# Patient Record
Sex: Male | Born: 1975 | Race: White | Hispanic: No | Marital: Married | Smoking: Never smoker
Health system: Southern US, Community
[De-identification: ages and names within clinical notes are randomized; demographics above are authoritative.]

## PROBLEM LIST (undated history)

## (undated) DIAGNOSIS — M109 Gout, unspecified: Secondary | ICD-10-CM

---

## 2009-02-13 ENCOUNTER — Ambulatory Visit: Payer: Self-pay | Admitting: General Practice

## 2009-11-13 ENCOUNTER — Ambulatory Visit: Payer: Self-pay | Admitting: Unknown Physician Specialty

## 2009-11-14 LAB — PATHOLOGY REPORT

## 2011-09-02 ENCOUNTER — Ambulatory Visit: Payer: Self-pay | Admitting: General Practice

## 2011-09-09 ENCOUNTER — Other Ambulatory Visit: Payer: Self-pay | Admitting: Podiatry

## 2011-09-09 LAB — SYNOVIAL FLUID, CRYSTAL: Crystals, Joint Fluid: NONE SEEN

## 2011-10-03 ENCOUNTER — Emergency Department: Payer: Self-pay | Admitting: Emergency Medicine

## 2011-10-03 LAB — CBC WITH DIFFERENTIAL/PLATELET
Basophil #: 0.1 10*3/uL (ref 0.0–0.1)
Lymphocyte %: 22.7 %
MCH: 31.4 pg (ref 26.0–34.0)
MCHC: 35.4 g/dL (ref 32.0–36.0)
Monocyte %: 8.6 %
Neutrophil #: 6.9 10*3/uL — ABNORMAL HIGH (ref 1.4–6.5)
Neutrophil %: 65.4 %
Platelet: 231 10*3/uL (ref 150–440)
RBC: 4.92 10*6/uL (ref 4.40–5.90)
RDW: 13.3 % (ref 11.5–14.5)

## 2011-10-03 LAB — URIC ACID: Uric Acid: 7.8 mg/dL — ABNORMAL HIGH (ref 3.5–7.2)

## 2013-01-07 IMAGING — CR RIGHT GREAT TOE
1 series · 3 of 3 positions shown · non-contrast
Comparison: none

REASON FOR EXAM: redness swelling pain
COMMENTS:

PROCEDURE:     KDR - KDXR TOE GREAT(1ST DIGIT)RT FOOT  - September 02, 2011  [DATE]
RESULT:     Comparison: None.

[Series 1: ap · 0.17mm/px · 3 of 3 slices shown]
[im 1/3]
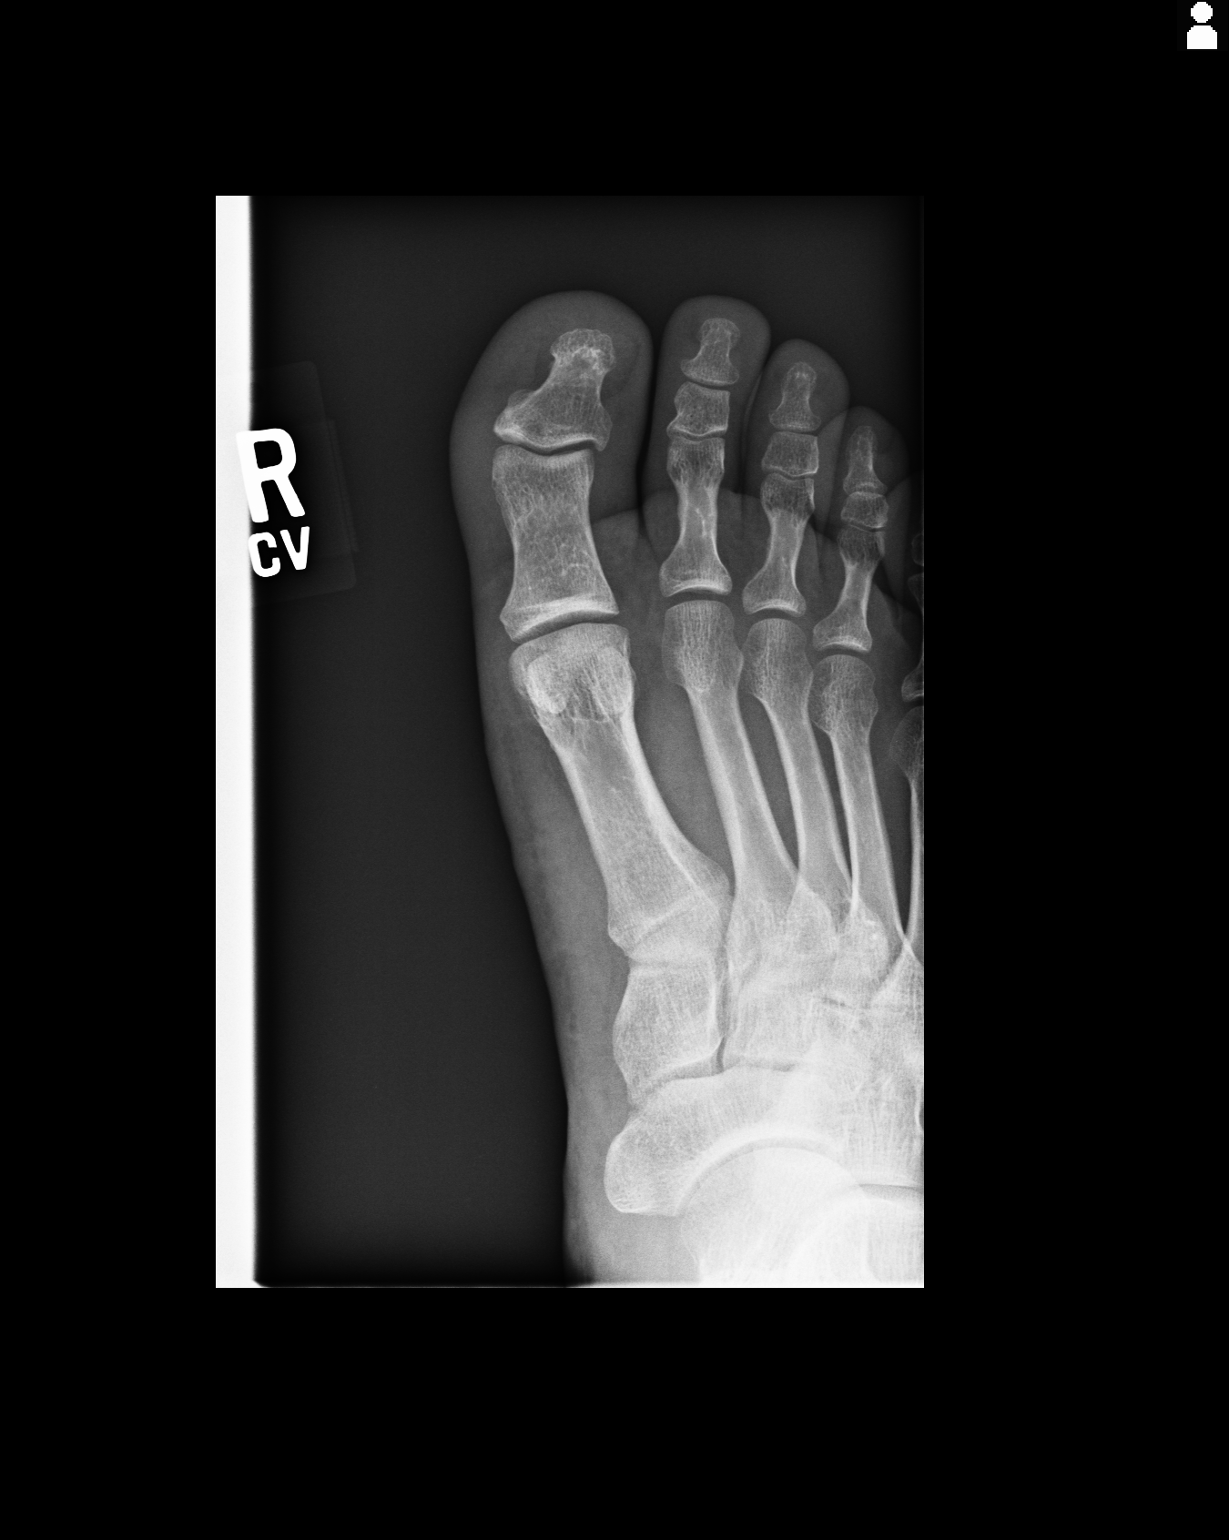
[im 2/3]
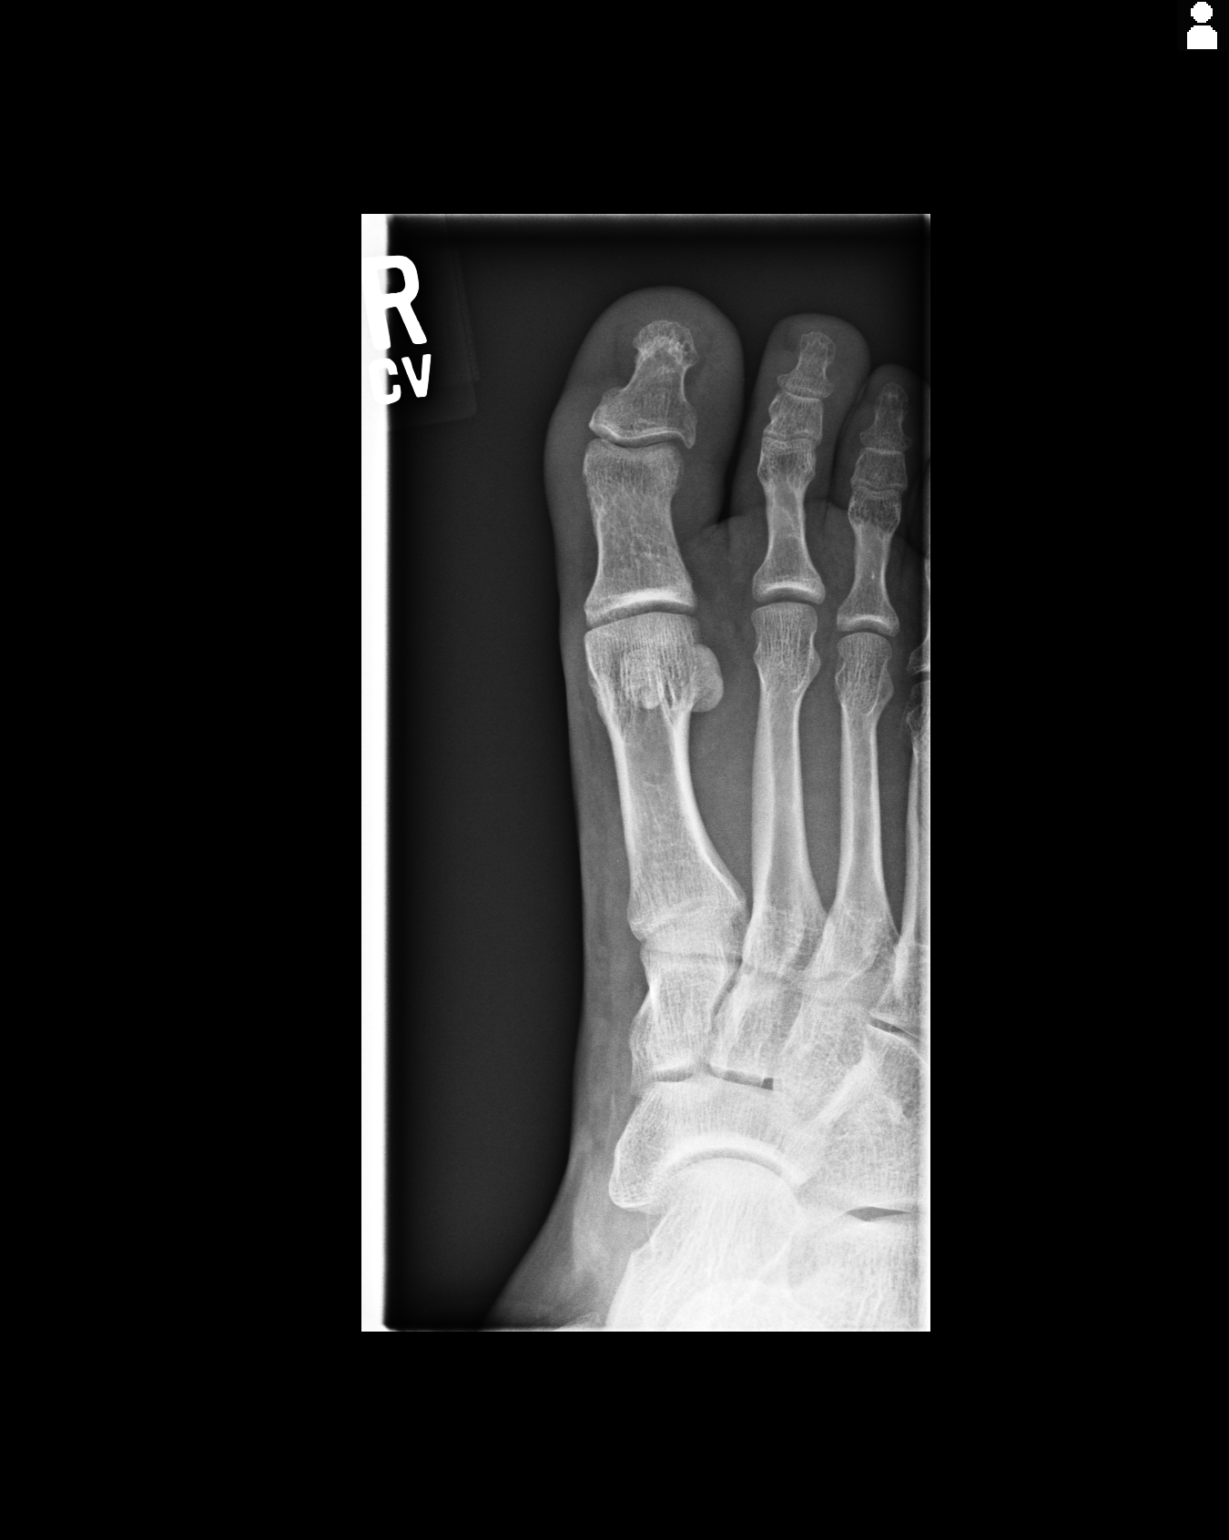
[im 3/3]
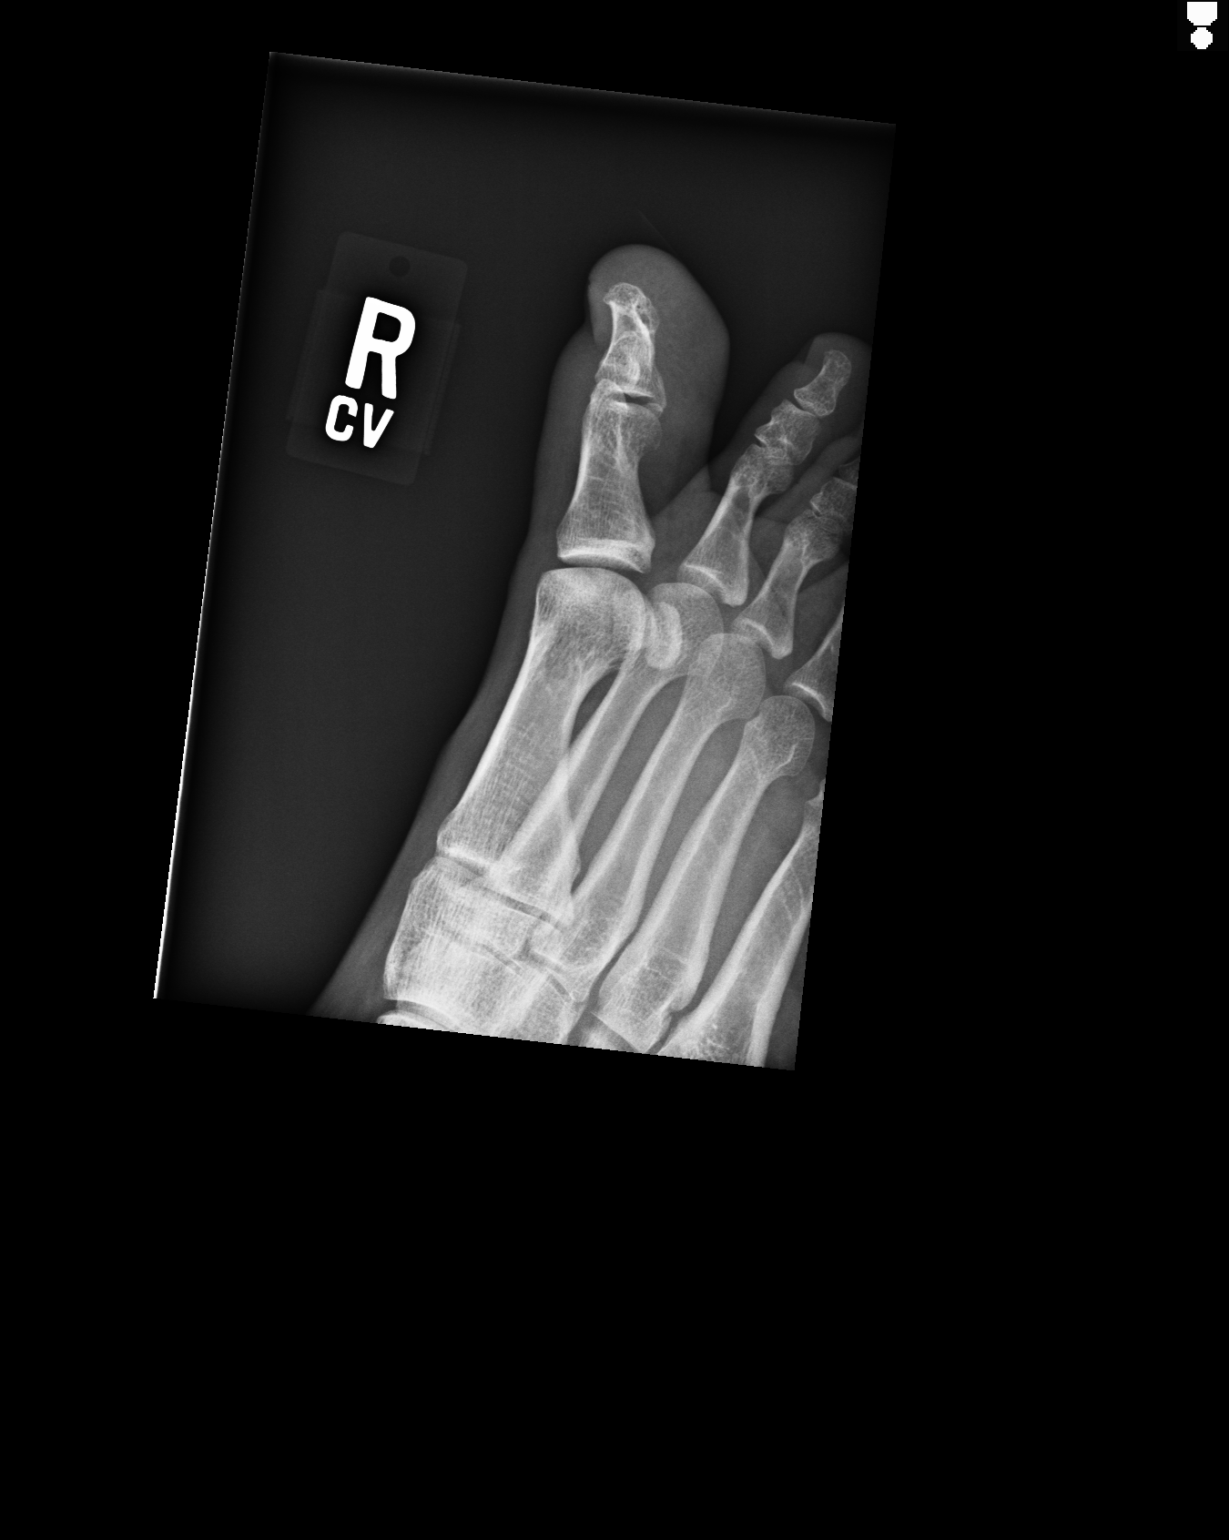

[3 of 3 positions shown; findings below may reference images not displayed]

FINDINGS: No acute fracture. No erosive changes. Joint spaces are maintained.
IMPRESSION: No acute findings.

[REDACTED]

## 2016-02-08 ENCOUNTER — Ambulatory Visit: Payer: Self-pay | Admitting: Physician Assistant

## 2016-02-11 ENCOUNTER — Encounter: Payer: Self-pay | Admitting: Physician Assistant

## 2016-02-11 ENCOUNTER — Ambulatory Visit: Payer: Self-pay | Admitting: Physician Assistant

## 2016-02-11 VITALS — BP 120/80 | HR 85 | Temp 98.4°F | Ht 67.0 in | Wt 228.0 lb

## 2016-02-11 DIAGNOSIS — Z0000 Encounter for general adult medical examination without abnormal findings: Secondary | ICD-10-CM

## 2016-02-11 MED ORDER — FLUTICASONE PROPIONATE 50 MCG/ACT NA SUSP: 2.0000 | Freq: Every day | NASAL | 12 refills | Status: DC

## 2016-02-11 NOTE — Progress Notes (Signed)
S: here for physical and wellness encounter for biometric forms, ros neg, med: flonase, nkda, nonsmoker, married, no children, fam hx + colon ca, had a colonoscopy screening nov 2017 which was normal  O: vitals wnl, nad, ENT wnl, neck supple no lymph, lungs c t a, cv rrr, abd soft nontender bs normal all 4 quads, prostate exam deferred by pt, ms wnl, n/v intact  A: well adult  P: flonase refill, labs drawn today

## 2016-02-12 LAB — CMP12+LP+TP+TSH+6AC+PSA+CBC…
ALT: 35 IU/L (ref 0–44)
AST: 26 IU/L (ref 0–40)
Albumin/Globulin Ratio: 1.8 (ref 1.2–2.2)
Albumin: 4.7 g/dL (ref 3.5–5.5)
Alkaline Phosphatase: 53 IU/L (ref 39–117)
BASOS: 1 %
BILIRUBIN TOTAL: 0.8 mg/dL (ref 0.0–1.2)
BUN / CREAT RATIO: 8 — AB (ref 9–20)
BUN: 9 mg/dL (ref 6–24)
Basophils Absolute: 0.1 10*3/uL (ref 0.0–0.2)
CALCIUM: 9.4 mg/dL (ref 8.7–10.2)
CHLORIDE: 100 mmol/L (ref 96–106)
CREATININE: 1.11 mg/dL (ref 0.76–1.27)
Chol/HDL Ratio: 5.4 ratio units — ABNORMAL HIGH (ref 0.0–5.0)
Cholesterol, Total: 190 mg/dL (ref 100–199)
EOS (ABSOLUTE): 0.1 10*3/uL (ref 0.0–0.4)
EOS: 1 %
Estimated CHD Risk: 1.1 times avg. — ABNORMAL HIGH (ref 0.0–1.0)
Free Thyroxine Index: 1.5 (ref 1.2–4.9)
GFR, EST AFRICAN AMERICAN: 94 mL/min/{1.73_m2} (ref >59)
GFR, EST NON AFRICAN AMERICAN: 81 mL/min/{1.73_m2} (ref >59)
GGT: 62 IU/L (ref 0–65)
GLUCOSE: 100 mg/dL — AB (ref 65–99)
Globulin, Total: 2.6 g/dL (ref 1.5–4.5)
HDL: 35 mg/dL — ABNORMAL LOW (ref >39)
Hematocrit: 43.3 % (ref 37.5–51.0)
Hemoglobin: 15.3 g/dL (ref 13.0–17.7)
Immature Grans (Abs): 0 10*3/uL (ref 0.0–0.1)
Immature Granulocytes: 0 %
Iron: 111 ug/dL (ref 38–169)
LDH: 185 IU/L (ref 121–224)
LDL Calculated: 95 mg/dL (ref 0–99)
Lymphocytes Absolute: 2.3 10*3/uL (ref 0.7–3.1)
Lymphs: 28 %
MCH: 30.9 pg (ref 26.6–33.0)
MCHC: 35.3 g/dL (ref 31.5–35.7)
MCV: 88 fL (ref 79–97)
MONOCYTES: 8 %
Monocytes Absolute: 0.7 10*3/uL (ref 0.1–0.9)
Neutrophils Absolute: 5.2 10*3/uL (ref 1.4–7.0)
Neutrophils: 62 %
PHOSPHORUS: 2.9 mg/dL (ref 2.5–4.5)
POTASSIUM: 4.4 mmol/L (ref 3.5–5.2)
Platelets: 245 10*3/uL (ref 150–379)
Prostate Specific Ag, Serum: 0.7 ng/mL (ref 0.0–4.0)
RBC: 4.95 x10E6/uL (ref 4.14–5.80)
RDW: 13.6 % (ref 12.3–15.4)
Sodium: 141 mmol/L (ref 134–144)
T3 Uptake Ratio: 28 % (ref 24–39)
T4, Total: 5.4 ug/dL (ref 4.5–12.0)
TSH: 0.996 u[IU]/mL (ref 0.450–4.500)
Total Protein: 7.3 g/dL (ref 6.0–8.5)
Triglycerides: 300 mg/dL — ABNORMAL HIGH (ref 0–149)
URIC ACID: 10.1 mg/dL — AB (ref 3.7–8.6)
VLDL CHOLESTEROL CAL: 60 mg/dL — AB (ref 5–40)
WBC: 8.4 10*3/uL (ref 3.4–10.8)

## 2016-02-12 LAB — VITAMIN D 25 HYDROXY (VIT D DEFICIENCY, FRACTURES): Vit D, 25-Hydroxy: 18.5 ng/mL — ABNORMAL LOW (ref 30.0–100.0)

## 2016-02-19 ENCOUNTER — Ambulatory Visit: Payer: Self-pay | Admitting: Physician Assistant

## 2016-02-19 VITALS — BP 139/79 | HR 104 | Temp 98.8°F

## 2016-02-19 DIAGNOSIS — E559 Vitamin D deficiency, unspecified: Secondary | ICD-10-CM

## 2016-02-19 DIAGNOSIS — J069 Acute upper respiratory infection, unspecified: Secondary | ICD-10-CM

## 2016-02-19 LAB — POCT INFLUENZA A/B
Influenza A, POC: NEGATIVE
Influenza B, POC: NEGATIVE

## 2016-02-19 MED ORDER — VITAMIN D (ERGOCALCIFEROL) 1.25 MG (50000 UNIT) PO CAPS: 50000.0000 [IU] | ORAL_CAPSULE | ORAL | Status: DC

## 2016-02-19 MED ORDER — VITAMIN D (ERGOCALCIFEROL) 1.25 MG (50000 UNIT) PO CAPS: 50000.0000 [IU] | ORAL_CAPSULE | ORAL | 8 refills | Status: DC

## 2016-02-19 NOTE — Progress Notes (Signed)
S: here with flu-like sx that started.  Has been taking OTC medication with some relief of sx except for coughing at night.  No fever known.  No N,V, D.    O: TMs dull bilat, nose clr, throat minimal injection, neck supple without aden.  Lung without wheeze but very course cough.  Heart RRR  A: Viral illness Also Hypovitaminosis D  P: Robitussin AC every 4 hours prn cough and Vitamin D 50,000 mg once a week and plan to recheck this summer.

## 2016-11-02 ENCOUNTER — Other Ambulatory Visit: Payer: Self-pay | Admitting: Emergency Medicine

## 2016-11-03 NOTE — Telephone Encounter (Signed)
Med refill for vit d approved

## 2017-04-16 ENCOUNTER — Ambulatory Visit: Payer: Self-pay | Admitting: Family Medicine

## 2017-04-16 ENCOUNTER — Encounter: Payer: Self-pay | Admitting: Family Medicine

## 2017-04-16 VITALS — BP 120/82 | HR 82 | Resp 16 | Ht 67.0 in | Wt 230.0 lb

## 2017-04-16 DIAGNOSIS — Z0000 Encounter for general adult medical examination without abnormal findings: Secondary | ICD-10-CM

## 2017-04-16 NOTE — Progress Notes (Signed)
Subjective: Annual biometrics screening  Patient presents for his annual biometric screening. Patient denies any medical problems except for a history of colon polyps.  Patient's father had colon cancer and he began early screening years ago.  Patient's last colonoscopy was clear.  Patient is not currently see a primary care provider.  Patient completed biometric screening lab results at the [facility] department.  His fasting blood sugar was 116, total cholesterol 252, and HDL 34. Patient works for the [employer] in the [employer]. Patient denies any other issues or concerns.   Review of Systems Constitutional: Unremarkable.  HEENT: Denies dizziness, issues with hearing, vision problems.  Gastrointestinal: Denies issues with bowel or bladder.  Respiratory: Unremarkable.   Cardiovascular: Unremarkable.  Musculoskeletal: No significant arthralgias, myalgias, joint swelling, joint stiffness, back pain, neck pain. ROS otherwise negative.   Objective  Physical Exam General: Awake, alert and oriented. No acute distress. Well developed, hydrated and nourished. Appears stated age.  HEENT: Supple neck without adenopathy. Sclera is non-icteric. The ear canal is clear without discharge. The tympanic membrane is normal in appearance with normal landmarks and cone of light. Nasal mucosa is pink and moist. Oral mucosa is pink and moist. The pharynx is normal in appearance without tonsillar swelling or exudates.  Skin: Skin in warm, dry and intact without rashes or lesions. Appropriate color for ethnicity. Cardiac: Heart rate and rhythm are normal. No murmurs, gallops, or rubs are auscultated.  Respiratory: The chest wall is symmetric and without deformity. No signs of respiratory distress. Lung sounds are clear in all lobes bilaterally without rales, ronchi, or wheezes.  Abdominal: Abdomen is soft, symmetric, and non-tender without distention. No masses, hepatomegaly, or splenomegaly are  noted.  Spine: Neck and back are without deformity. Curvature of the cervical, thoracic, and lumbar spine are within normal limits. Posture is upright, gait is smooth, steady, and within normal limits. No tenderness noted on palpation of the spinous processes. Spinous processes are midline. No discomfort is noted with flexion, extension, and side-to-side rotation of the cervical/thoracic/lumbar spine, full range of motion is noted. Grip strength is normal bilaterally.  Extremities: Full range of motion is noted to all major joints. Steady gait noted.  Neurological: The patient is awake, alert and oriented to person, place, and time with normal speech.  Memory is normal and thought processes intact. No gait abnormalities are appreciated.  Psychiatric: Appropriate mood and affect.   Assessment Annual biometrics screen  Plan  Labs pending. Encouraged routine visits with primary care provider.  Provided patient with resources to establish care with a new primary care provider.  Educated patient regarding his abnormal fasting glucose and lipid panel and the implications of these abnormal values.  Advised patient to discuss this with his primary care provider at his first visit within the next month.

## 2018-03-05 ENCOUNTER — Other Ambulatory Visit: Payer: Self-pay

## 2018-03-05 ENCOUNTER — Ambulatory Visit: Payer: Self-pay | Admitting: Emergency Medicine

## 2018-03-05 VITALS — BP 118/86 | HR 86 | Temp 98.2°F | Resp 12

## 2018-03-05 DIAGNOSIS — M25561 Pain in right knee: Secondary | ICD-10-CM

## 2018-03-05 MED ORDER — INDOMETHACIN 50 MG PO CAPS: 50.0000 mg | ORAL_CAPSULE | Freq: Three times a day (TID) | ORAL | 0 refills | Status: AC

## 2018-03-05 NOTE — Progress Notes (Signed)
Subjective: Patient was doing some weightlifting on Monday.  He did not remember a specific injury.  On Wednesday he started having pain and discomfort in his right knee.  He has had significant swelling in the knee which also feels slightly warm.  He has had no fever or chills.  His knee feels stiff due to the fluid on his knee.  The knee feels warm but there is no redness.  He does not feel ill.  Patient took naproxen yesterday.  His main concern is inability to bend his knee.  Of note in 2013 he had aspiration of a joint.  He does have a history of gout in his great toes.  Previous uric acid was 6.7 this was in 2013 Review of systems: He is a drinker primarily on weekends. Objective: Left knee examination is normal. Right knee there is a significant joint effusion.  There is a mild increase in warmth but no redness.  Patient is able to flex and extend without discomfort.  There is no medial or lateral instability.  Anterior drawer sign is negative.  There is limited full extension due to fluid on the joint. Assessment: Patient presents with right knee effusion with previous history of gout.  He does have a family history of gout and does consume alcohol. Plan: Indomethacin 50 mg every 8. Follow-up with PCP Check uric acid and basic metabolic panel. Instruction sheet related to gout given to patient.

## 2018-03-05 NOTE — Patient Instructions (Signed)
Take your indomethacin as instructed. Please restrict activity for the next few days. You can try an ice pack to your knee for pain relief. Please establish yourself with a primary care physician. We will call you with lab results.     Gout  Gout is painful swelling of your joints. Gout is a type of arthritis. It is caused by having too much uric acid in your body. Uric acid is a chemical that is made when your body breaks down substances called purines. If your body has too much uric acid, sharp crystals can form and build up in your joints. This causes pain and swelling. Gout attacks can happen quickly and be very painful (acute gout). Over time, the attacks can affect more joints and happen more often (chronic gout). What are the causes?  Too much uric acid in your blood. This can happen because: ? Your kidneys do not remove enough uric acid from your blood. ? Your body makes too much uric acid. ? You eat too many foods that are high in purines. These foods include organ meats, some seafood, and beer.  Trauma or stress. What increases the risk?  Having a family history of gout.  Being male and middle-aged.  Being male and having gone through menopause.  Being very overweight (obese).  Drinking alcohol, especially beer.  Not having enough water in the body (being dehydrated).  Losing weight too quickly.  Having an organ transplant.  Having lead poisoning.  Taking certain medicines.  Having kidney disease.  Having a skin condition called psoriasis. What are the signs or symptoms? An attack of acute gout usually happens in just one joint. The most common place is the big toe. Attacks often start at night. Other joints that may be affected include joints of the feet, ankle, knee, fingers, wrist, or elbow. Symptoms of an attack may include:  Very bad pain.  Warmth.  Swelling.  Stiffness.  Shiny, red, or purple skin.  Tenderness. The affected joint may be  very painful to touch.  Chills and fever. Chronic gout may cause symptoms more often. More joints may be involved. You may also have white or yellow lumps (tophi) on your hands or feet or in other areas near your joints. How is this treated?  Treatment for this condition has two phases: treating an acute attack and preventing future attacks.  Acute gout treatment may include: ? NSAIDs. ? Steroids. These are taken by mouth or injected into a joint. ? Colchicine. This medicine relieves pain and swelling. It can be given by mouth or through an IV tube.  Preventive treatment may include: ? Taking small doses of NSAIDs or colchicine daily. ? Using a medicine that reduces uric acid levels in your blood. ? Making changes to your diet. You may need to see a food expert (dietitian) about what to eat and drink to prevent gout. Follow these instructions at home: During a gout attack   If told, put ice on the painful area: ? Put ice in a plastic bag. ? Place a towel between your skin and the bag. ? Leave the ice on for 20 minutes, 2-3 times a day.  Raise (elevate) the painful joint above the level of your heart as often as you can.  Rest the joint as much as possible. If the joint is in your leg, you may be given crutches.  Follow instructions from your doctor about what you cannot eat or drink. Avoiding future gout attacks  Eat a  low-purine diet. Avoid foods and drinks such as: ? Liver. ? Kidney. ? Anchovies. ? Asparagus. ? Herring. ? Mushrooms. ? Mussels. ? Beer.  Stay at a healthy weight. If you want to lose weight, talk with your doctor. Do not lose weight too fast.  Start or continue an exercise plan as told by your doctor. Eating and drinking  Drink enough fluids to keep your pee (urine) pale yellow.  If you drink alcohol: ? Limit how much you use to:  0-1 drink a day for women.  0-2 drinks a day for men. ? Be aware of how much alcohol is in your drink. In the [place]  one drink equals one 12 oz bottle of beer (355 mL), one 5 oz glass of wine (148 mL), or one 1 oz glass of hard liquor (44 mL). General instructions  Take over-the-counter and prescription medicines only as told by your doctor.  Do not drive or use heavy machinery while taking prescription pain medicine.  Return to your normal activities as told by your doctor. Ask your doctor what activities are safe for you.  Keep all follow-up visits as told by your doctor. This is important. Contact a doctor if:  You have another gout attack.  You still have symptoms of a gout attack after 10 days of treatment.  You have problems (side effects) because of your medicines.  You have chills or a fever.  You have burning pain when you pee (urinate).  You have pain in your lower back or belly. Get help right away if:  You have very bad pain.  Your pain cannot be controlled.  You cannot pee. Summary  Gout is painful swelling of the joints.  The most common site of pain is the big toe, but it can affect other joints.  Medicines and avoiding some foods can help to prevent and treat gout attacks. This information is not intended to replace advice given to you by your health care provider. Make sure you discuss any questions you have with your health care provider. Document Released: 10/23/2007 Document Revised: 08/05/2017 Document Reviewed: 08/05/2017 Elsevier Interactive Patient Education  2019 [employer].

## 2018-03-06 LAB — BASIC METABOLIC PANEL
BUN/Creatinine Ratio: 11 (ref 9–20)
BUN: 11 mg/dL (ref 6–24)
CHLORIDE: 103 mmol/L (ref 96–106)
CO2: 21 mmol/L (ref 20–29)
Calcium: 9.2 mg/dL (ref 8.7–10.2)
Creatinine, Ser: 0.99 mg/dL (ref 0.76–1.27)
GFR calc Af Amer: 107 mL/min/{1.73_m2} (ref >59)
GFR, EST NON AFRICAN AMERICAN: 92 mL/min/{1.73_m2} (ref >59)
Glucose: 111 mg/dL — ABNORMAL HIGH (ref 65–99)
POTASSIUM: 4.6 mmol/L (ref 3.5–5.2)
SODIUM: 142 mmol/L (ref 134–144)

## 2018-03-06 LAB — URIC ACID: URIC ACID: 8.5 mg/dL (ref 3.7–8.6)

## 2018-09-13 ENCOUNTER — Other Ambulatory Visit: Payer: Self-pay

## 2018-09-13 ENCOUNTER — Encounter: Payer: Self-pay | Admitting: Adult Health

## 2018-09-13 ENCOUNTER — Ambulatory Visit: Payer: Managed Care, Other (non HMO) | Admitting: Adult Health

## 2018-09-13 VITALS — BP 138/98 | HR 87 | Temp 97.8°F | Resp 16 | Ht 67.0 in | Wt 227.0 lb

## 2018-09-13 DIAGNOSIS — Z0189 Encounter for other specified special examinations: Secondary | ICD-10-CM | POA: Diagnosis not present

## 2018-09-13 DIAGNOSIS — Z008 Encounter for other general examination: Secondary | ICD-10-CM

## 2018-09-13 NOTE — Progress Notes (Signed)
[place] DOB: 43 y.o. MRN: [id number]  Subjective:  Here for Biometric Screen/brief exam Patient is a 43 year old male in no acute distress who comes to the  clinic for a brief biometric exam and biometric screening for his employers insurance, he is employed with [employer] with the [employer].   Nathanial Millman, PA is his new primary care provider.   There are no active problems to display for this patient.  History of Gout in the past.   Current Outpatient Medications:  .  indomethacin (INDOCIN) 50 MG capsule, Take 1 capsule (50 mg total) by mouth 3 (three) times daily with meals., Disp: 30 capsule, Rfl: 0   Patient  denies any fever, body aches,chills, rash, chest pain, shortness of breath, nausea, vomiting, or diarrhea.    Objective: Blood pressure (!) 138/98, pulse 87, temperature 97.8 F (36.6 C), temperature source Temporal, resp. rate 16, height 5\' 7"  (1.702 m), weight 227 lb (103 kg), SpO2 98 %. Body mass index is 35.55 kg/m.  NAD well developed well nourished.  HEENT: Within normal limits Neck: Normal, supple, thyroid normal, no cervical lymphadenopathy  Heart: Regular rate and rhythm Lungs: Clear to auscultation no adventitious sounds.   Assessment: Biometric screen    ICD-10-CM   1. Encounter for other general examination- brief biometric exam with biometric screening not a full annual exam   Z00.8 Glucose, random    Lipid Panel With LDL/HDL Ratio  2. Encounter for biometric screening  Z01.89 Glucose, random    Lipid Panel With LDL/HDL Ratio    Plan: Elevated blood pressure at this visit, monitor diet, sodium intake, heart healthy diet with lifestyle/ exercise and diet changes recommended.Keep record of blood pressure and follow up with your primary care provider Nathanial Millman, PA In 2 weeks for a recheck and sooner if any RED FLAG symptoms. Emergency  medical care immediately if Red Flags. Fasting glucose and lipids. Discussed with patient that today's visit here is a limited biometric screening visit (not a comprehensive exam or management of any chronic problems) Discussed some health issues, including healthy eating habits and exercise. Encouraged to follow-up with PCP for annual comprehensive preventive and wellness care (and if applicable, any chronic issues). Questions invited and answered.   I will have the office call you on your glucose and cholesterol results when they return if you have not heard within 1 week please call the office.  This biometric physical is a brief physical and the only labs done are glucose and your lipid panel(cholesterol) and is  not a substitute for seeing a primary care provider for a complete annual physical. Please see a primary care physician for routine health maintenance, labs and full physical at least yearly and follow up as recommended by your provider. Provider also recommends if you do not have a primary care provider for patient to establish care as soon as possible .Patient may chose provider of choice. Also gave the [place] at [phone] or web site at [email or web address] to help assist with finding a primary care doctor.  Patient verbalizes understanding that his office is acute care only and not a substitute for a primary care or for the management of chronic conditions.    Follow up with primary care as needed for chronic and maintenance health care- can be seen in this employee clinic for acute care. ,

## 2018-09-13 NOTE — Patient Instructions (Signed)
I will have the office call you on your glucose and cholesterol results when they return if you have not heard within 1 week please call the office.  This biometric physical is a brief physical and the only labs done are glucose and your lipid panel(cholesterol) and is  not a substitute for seeing a primary care provider for a complete annual physical. Please see a primary care physician for routine health maintenance, labs and full physical at least yearly and follow up as recommended by your provider. Provider also recommends if you do not have a primary care provider for patient to establish care as soon as possible .Patient may chose provider of choice. Also gave the [place] at [phone] or web site at [email or web address] to help assist with finding a primary care doctor.  Patient verbalizes understanding that his office is acute care only and not a substitute for a primary care or for the management of chronic conditions.    Follow up with primary care as needed for chronic and maintenance health care- can be seen in this employee clinic for acute care.    Heart-Healthy Eating Plan Heart-healthy meal planning includes:  Eating less unhealthy fats.  Eating more healthy fats.  Making other changes in your diet. Talk with your doctor or a diet specialist (dietitian) to create an eating plan that is right for you. What is my plan? Your doctor may recommend an eating plan that includes:  Total fat: ______% or less of total calories a day.  Saturated fat: ______% or less of total calories a day.  Cholesterol: less than _________mg a day. What are tips for following this plan? Cooking Avoid frying your food. Try to bake, boil, grill, or broil it instead. You can also reduce fat by:  Removing the skin from poultry.  Removing all visible fats from meats.  Steaming vegetables in water or broth. Meal planning   At meals, divide your plate into  four equal parts: ? Fill one-half of your plate with vegetables and green salads. ? Fill one-fourth of your plate with whole grains. ? Fill one-fourth of your plate with lean protein foods.  Eat 4-5 servings of vegetables per day. A serving of vegetables is: ? 1 cup of raw or cooked vegetables. ? 2 cups of raw leafy greens.  Eat 4-5 servings of fruit per day. A serving of fruit is: ? 1 medium whole fruit. ?  cup of dried fruit. ?  cup of fresh, frozen, or canned fruit. ?  cup of 100% fruit juice.  Eat more foods that have soluble fiber. These are apples, broccoli, carrots, beans, peas, and barley. Try to get 20-30 g of fiber per day.  Eat 4-5 servings of nuts, legumes, and seeds per week: ? 1 serving of dried beans or legumes equals  cup after being cooked. ? 1 serving of nuts is  cup. ? 1 serving of seeds equals 1 tablespoon. General information  Eat more home-cooked food. Eat less restaurant, buffet, and fast food.  Limit or avoid alcohol.  Limit foods that are high in starch and sugar.  Avoid fried foods.  Lose weight if you are overweight.  Keep track of how much salt (sodium) you eat. This is important if you have high blood pressure. Ask your doctor to tell you more about this.  Try to add vegetarian meals each week. Fats  Choose healthy fats. These include olive oil and canola oil, flaxseeds, walnuts,  almonds, and seeds.  Eat more omega-3 fats. These include salmon, mackerel, sardines, tuna, flaxseed oil, and ground flaxseeds. Try to eat fish at least 2 times each week.  Check food labels. Avoid foods with trans fats or high amounts of saturated fat.  Limit saturated fats. ? These are often found in animal products, such as meats, butter, and cream. ? These are also found in plant foods, such as palm oil, palm kernel oil, and coconut oil.  Avoid foods with partially hydrogenated oils in them. These have trans fats. Examples are stick margarine, some tub  margarines, cookies, crackers, and other baked goods. What foods can I eat? Fruits All fresh, canned (in natural juice), or frozen fruits. Vegetables Fresh or frozen vegetables (raw, steamed, roasted, or grilled). Green salads. Grains Most grains. Choose whole wheat and whole grains most of the time. Rice and pasta, including brown rice and pastas made with whole wheat. Meats and other proteins Lean, well-trimmed beef, veal, pork, and lamb. Chicken and [place] without skin. All fish and shellfish. Wild duck, rabbit, pheasant, and venison. Egg whites or low-cholesterol egg substitutes. Dried beans, peas, lentils, and tofu. Seeds and most nuts. Dairy Low-fat or nonfat cheeses, including ricotta and mozzarella. Skim or 1% milk that is liquid, powdered, or evaporated. Buttermilk that is made with low-fat milk. Nonfat or low-fat yogurt. Fats and oils Non-hydrogenated (trans-free) margarines. Vegetable oils, including soybean, sesame, sunflower, olive, peanut, safflower, corn, canola, and cottonseed. Salad dressings or mayonnaise made with a vegetable oil. Beverages Mineral water. Coffee and tea. Diet carbonated beverages. Sweets and desserts Sherbet, gelatin, and fruit ice. Small amounts of dark chocolate. Limit all sweets and desserts. Seasonings and condiments All seasonings and condiments. The items listed above may not be a complete list of foods and drinks you can eat. Contact a dietitian for more options. What foods should I avoid? Fruits Canned fruit in heavy syrup. Fruit in cream or butter sauce. Fried fruit. Limit coconut. Vegetables Vegetables cooked in cheese, cream, or butter sauce. Fried vegetables. Grains Breads that are made with saturated or trans fats, oils, or whole milk. Croissants. Sweet rolls. Donuts. High-fat crackers, such as cheese crackers. Meats and other proteins Fatty meats, such as hot dogs, ribs, sausage, bacon, rib-eye roast or steak. High-fat deli meats, such  as salami and bologna. Caviar. Domestic duck and goose. Organ meats, such as liver. Dairy Cream, sour cream, cream cheese, and creamed cottage cheese. Whole-milk cheeses. Whole or 2% milk that is liquid, evaporated, or condensed. Whole buttermilk. Cream sauce or high-fat cheese sauce. Yogurt that is made from whole milk. Fats and oils Meat fat, or shortening. Cocoa butter, hydrogenated oils, palm oil, coconut oil, palm kernel oil. Solid fats and shortenings, including bacon fat, salt pork, lard, and butter. Nondairy cream substitutes. Salad dressings with cheese or sour cream. Beverages Regular sodas and juice drinks with added sugar. Sweets and desserts Frosting. Pudding. Cookies. Cakes. Pies. Milk chocolate or white chocolate. Buttered syrups. Full-fat ice cream or ice cream drinks. The items listed above may not be a complete list of foods and drinks to avoid. Contact a dietitian for more information. Summary  Heart-healthy meal planning includes eating less unhealthy fats, eating more healthy fats, and making other changes in your diet.  Eat a balanced diet. This includes fruits and vegetables, low-fat or nonfat dairy, lean protein, nuts and legumes, whole grains, and heart-healthy oils and fats. This information is not intended to replace advice given to you by your health care  provider. Make sure you discuss any questions you have with your health care provider. Document Released: 07/15/2011 Document Revised: 03/19/2017 Document Reviewed: 02/20/2017 Elsevier Patient Education  2020 [employer]. Hypertension, Adult Hypertension is another name for high blood pressure. High blood pressure forces your heart to work harder to pump blood. This can cause problems over time. There are two numbers in a blood pressure reading. There is a top number (systolic) over a bottom number (diastolic). It is best to have a blood pressure that is below 120/80. Healthy choices can help lower your blood  pressure, or you may need medicine to help lower it. What are the causes? The cause of this condition is not known. Some conditions may be related to high blood pressure. What increases the risk?  Smoking.  Having type 2 diabetes mellitus, high cholesterol, or both.  Not getting enough exercise or physical activity.  Being overweight.  Having too much fat, sugar, calories, or salt (sodium) in your diet.  Drinking too much alcohol.  Having long-term (chronic) kidney disease.  Having a family history of high blood pressure.  Age. Risk increases with age.  Race. You may be at higher risk if you are African American.  Gender. Men are at higher risk than women before age 27. After age 45, women are at higher risk than men.  Having obstructive sleep apnea.  Stress. What are the signs or symptoms?  High blood pressure may not cause symptoms. Very high blood pressure (hypertensive crisis) may cause: ? Headache. ? Feelings of worry or nervousness (anxiety). ? Shortness of breath. ? Nosebleed. ? A feeling of being sick to your stomach (nausea). ? Throwing up (vomiting). ? Changes in how you see. ? Very bad chest pain. ? Seizures. How is this treated?  This condition is treated by making healthy lifestyle changes, such as: ? Eating healthy foods. ? Exercising more. ? Drinking less alcohol.  Your health care provider may prescribe medicine if lifestyle changes are not enough to get your blood pressure under control, and if: ? Your top number is above 130. ? Your bottom number is above 80.  Your personal target blood pressure may vary. Follow these instructions at home: Eating and drinking   If told, follow the DASH eating plan. To follow this plan: ? Fill one half of your plate at each meal with fruits and vegetables. ? Fill one fourth of your plate at each meal with whole grains. Whole grains include whole-wheat pasta, brown rice, and whole-grain bread. ? Eat or  drink low-fat dairy products, such as skim milk or low-fat yogurt. ? Fill one fourth of your plate at each meal with low-fat (lean) proteins. Low-fat proteins include fish, chicken without skin, eggs, beans, and tofu. ? Avoid fatty meat, cured and processed meat, or chicken with skin. ? Avoid pre-made or processed food.  Eat less than 1,500 mg of salt each day.  Do not drink alcohol if: ? Your doctor tells you not to drink. ? You are pregnant, may be pregnant, or are planning to become pregnant.  If you drink alcohol: ? Limit how much you use to:  0-1 drink a day for women.  0-2 drinks a day for men. ? Be aware of how much alcohol is in your drink. In the [place] one drink equals one 12 oz bottle of beer (355 mL), one 5 oz glass of wine (148 mL), or one 1 oz glass of hard liquor (44 mL). Lifestyle   Work with  your doctor to stay at a healthy weight or to lose weight. Ask your doctor what the best weight is for you.  Get at least 30 minutes of exercise most days of the week. This may include walking, swimming, or biking.  Get at least 30 minutes of exercise that strengthens your muscles (resistance exercise) at least 3 days a week. This may include lifting weights or doing Pilates.  Do not use any products that contain nicotine or tobacco, such as cigarettes, e-cigarettes, and chewing tobacco. If you need help quitting, ask your doctor.  Check your blood pressure at home as told by your doctor.  Keep all follow-up visits as told by your doctor. This is important. Medicines  Take over-the-counter and prescription medicines only as told by your doctor. Follow directions carefully.  Do not skip doses of blood pressure medicine. The medicine does not work as well if you skip doses. Skipping doses also puts you at risk for problems.  Ask your doctor about side effects or reactions to medicines that you should watch for. Contact a doctor if you:  Think you are having a reaction to  the medicine you are taking.  Have headaches that keep coming back (recurring).  Feel dizzy.  Have swelling in your ankles.  Have trouble with your vision. Get help right away if you:  Get a very bad headache.  Start to feel mixed up (confused).  Feel weak or numb.  Feel faint.  Have very bad pain in your: ? Chest. ? Belly (abdomen).  Throw up more than once.  Have trouble breathing. Summary  Hypertension is another name for high blood pressure.  High blood pressure forces your heart to work harder to pump blood.  For most people, a normal blood pressure is less than 120/80.  Making healthy choices can help lower blood pressure. If your blood pressure does not get lower with healthy choices, you may need to take medicine. This information is not intended to replace advice given to you by your health care provider. Make sure you discuss any questions you have with your health care provider. Document Released: 07/02/2007 Document Revised: 09/23/2017 Document Reviewed: 09/23/2017 Elsevier Patient Education  2020 [place]. Preventing Hypertension Hypertension, commonly called high blood pressure, is when the force of blood pumping through the arteries is too strong. Arteries are blood vessels that carry blood from the heart throughout the body. Over time, hypertension can damage the arteries and decrease blood flow to important parts of the body, including the brain, heart, and kidneys. Often, hypertension does not cause symptoms until blood pressure is very high. For this reason, it is important to have your blood pressure checked on a regular basis. Hypertension can often be prevented with diet and lifestyle changes. If you already have hypertension, you can control it with diet and lifestyle changes, as well as medicine. What nutrition changes can be made? Maintain a healthy diet. This includes:  Eating less salt (sodium). Ask your health care provider how much sodium  is safe for you to have. The general recommendation is to consume less than 1 tsp (2,300 mg) of sodium a day. ? Do not add salt to your food. ? Choose low-sodium options when grocery shopping and eating out.  Limiting fats in your diet. You can do this by eating low-fat or fat-free dairy products and by eating less red meat.  Eating more fruits, vegetables, and whole grains. Make a goal to eat: ? 1-2 cups of fresh fruits  and vegetables each day. ? 3-4 servings of whole grains each day.  Avoiding foods and beverages that have added sugars.  Eating fish that contain healthy fats (omega-3 fatty acids), such as mackerel or salmon. If you need help putting together a healthy eating plan, try the DASH diet. This diet is high in fruits, vegetables, and whole grains. It is low in sodium, red meat, and added sugars. DASH stands for Dietary Approaches to Stop Hypertension. What lifestyle changes can be made?   Lose weight if you are overweight. Losing just 3?5% of your body weight can help prevent or control hypertension. ? For example, if your present weight is 200 lb (91 kg), a loss of 3-5% of your weight means losing 6-10 lb (2.7-4.5 kg). ? Ask your health care provider to help you with a diet and exercise plan to safely lose weight.  Get enough exercise. Do at least 150 minutes of moderate-intensity exercise each week. ? You could do this in short exercise sessions several times a day, or you could do longer exercise sessions a few times a week. For example, you could take a brisk 10-minute walk or bike ride, 3 times a day, for 5 days a week.  Find ways to reduce stress, such as exercising, meditating, listening to music, or taking a yoga class. If you need help reducing stress, ask your health care provider.  Do not smoke. This includes e-cigarettes. Chemicals in tobacco and nicotine products raise your blood pressure each time you smoke. If you need help quitting, ask your health care  provider.  Avoid alcohol. If you drink alcohol, limit alcohol intake to no more than 1 drink a day for nonpregnant women and 2 drinks a day for men. One drink equals 12 oz of beer, 5 oz of wine, or 1 oz of hard liquor. Why are these changes important? Diet and lifestyle changes can help you prevent hypertension, and they may make you feel better overall and improve your quality of life. If you have hypertension, making these changes will help you control it and help prevent major complications, such as:  Hardening and narrowing of arteries that supply blood to: ? Your heart. This can cause a heart attack. ? Your brain. This can cause a stroke. ? Your kidneys. This can cause kidney failure.  Stress on your heart muscle, which can cause heart failure. What can I do to lower my risk?  Work with your health care provider to make a hypertension prevention plan that works for you. Follow your plan and keep all follow-up visits as told by your health care provider.  Learn how to check your blood pressure at home. Make sure that you know your personal target blood pressure, as told by your health care provider. How is this treated? In addition to diet and lifestyle changes, your health care provider may recommend medicines to help lower your blood pressure. You may need to try a few different medicines to find what works best for you. You also may need to take more than one medicine. Take over-the-counter and prescription medicines only as told by your health care provider. Where to find support Your health care provider can help you prevent hypertension and help you keep your blood pressure at a healthy level. Your local hospital or your community may also provide support services and prevention programs. The American Heart Association offers an online support network at: CheapBootlegs.com.cy Where to find more information Learn more about hypertension from:   [employer]  Heart, Lung, and Blood Institute: ElectronicHangman.is  Centers for Disease Control and Prevention: https://ingram.com/  American Academy of Family Physicians: http://familydoctor.org/familydoctor/en/diseases-conditions/high-blood-pressure.printerview.all.html Learn more about the DASH diet from:  [place], Lung, and [place]: https://www.reyes.com/ Contact a health care provider if:  You think you are having a reaction to medicines you have taken.  You have recurrent headaches or feel dizzy.  You have swelling in your ankles.  You have trouble with your vision. Summary  Hypertension often does not cause any symptoms until blood pressure is very high. It is important to get your blood pressure checked regularly.  Diet and lifestyle changes are the most important steps in preventing hypertension.  By keeping your blood pressure in a healthy range, you can prevent complications like heart attack, heart failure, stroke, and kidney failure.  Work with your health care provider to make a hypertension prevention plan that works for you. This information is not intended to replace advice given to you by your health care provider. Make sure you discuss any questions you have with your health care provider. Document Released: 01/28/2015 Document Revised: 05/07/2018 Document Reviewed: 09/24/2015 Elsevier Patient Education  2020 [employer].

## 2018-09-14 ENCOUNTER — Encounter: Payer: Self-pay | Admitting: Adult Health

## 2018-09-14 LAB — LIPID PANEL WITH LDL/HDL RATIO
Cholesterol, Total: 220 mg/dL — ABNORMAL HIGH (ref 100–199)
HDL: 46 mg/dL (ref >39)
LDL Calculated: 100 mg/dL — ABNORMAL HIGH (ref 0–99)
LDl/HDL Ratio: 2.2 ratio (ref 0.0–3.6)
Triglycerides: 371 mg/dL — ABNORMAL HIGH (ref 0–149)
VLDL Cholesterol Cal: 74 mg/dL — ABNORMAL HIGH (ref 5–40)

## 2018-09-14 LAB — GLUCOSE, RANDOM: Glucose: 100 mg/dL — ABNORMAL HIGH (ref 65–99)

## 2018-09-14 NOTE — Progress Notes (Signed)
Results were sent to patient's my chart with education information and follow-up with primary care within 1 to 2 weeks, sooner if needed.

## 2018-10-13 DIAGNOSIS — D239 Other benign neoplasm of skin, unspecified: Secondary | ICD-10-CM

## 2018-10-13 HISTORY — DX: Other benign neoplasm of skin, unspecified: D23.9

## 2019-04-18 ENCOUNTER — Ambulatory Visit: Payer: Self-pay | Admitting: Dermatology

## 2021-10-16 ENCOUNTER — Encounter: Payer: Self-pay | Admitting: Emergency Medicine

## 2021-10-16 ENCOUNTER — Ambulatory Visit
Admission: EM | Admit: 2021-10-16 | Discharge: 2021-10-16 | Disposition: A | Discharge status: Home / Self Care | Payer: Managed Care, Other (non HMO) | Attending: Physician Assistant | Admitting: Physician Assistant

## 2021-10-16 DIAGNOSIS — Z20822 Contact with and (suspected) exposure to covid-19: Secondary | ICD-10-CM | POA: Insufficient documentation

## 2021-10-16 DIAGNOSIS — R051 Acute cough: Secondary | ICD-10-CM | POA: Insufficient documentation

## 2021-10-16 DIAGNOSIS — J069 Acute upper respiratory infection, unspecified: Secondary | ICD-10-CM | POA: Insufficient documentation

## 2021-10-16 DIAGNOSIS — J029 Acute pharyngitis, unspecified: Secondary | ICD-10-CM | POA: Insufficient documentation

## 2021-10-16 HISTORY — DX: Gout, unspecified: M10.9

## 2021-10-16 LAB — GROUP A STREP BY PCR: Group A Strep by PCR: NOT DETECTED

## 2021-10-16 LAB — SARS CORONAVIRUS 2 BY RT PCR: SARS Coronavirus 2 by RT PCR: NEGATIVE

## 2021-10-16 MED ORDER — PROMETHAZINE-DM 6.25-15 MG/5ML PO SYRP: 5.0000 mL | ORAL_SOLUTION | Freq: Four times a day (QID) | ORAL | 0 refills | Status: AC | PRN

## 2021-10-16 NOTE — Discharge Instructions (Addendum)
-  We will call with the strep is positive and send antibiotics for you. - If the COVID is positive we will also call.  You need to isolate 5 days from symptom onset and then wear a mask for 5 days if it is positive.  I would go from when the cough started. -If those tests are negative, you likely have another viral illness. - I have sent cough medicine to the pharmacy for you.  You can also take ibuprofen or Tylenol and use throat lozenges, Chloraseptic spray.  You should feel better in the next week.

## 2021-10-16 NOTE — ED Triage Notes (Signed)
Pt c/o sore throat, fever (100.6), cough, headache, and nasal congestion. Started about a week ago. He states when he woke up this morning the pain was worse and he had a fever.

## 2021-10-16 NOTE — ED Provider Notes (Signed)
[facility]    CSN: [id number] Arrival date & time: 10/16/21  0935      History   Chief Complaint Chief Complaint  Patient presents with   Sore Throat    HPI Victor Guzman is a 46 y.o. male presenting for fever up to 100.6 degrees, fatigue, cough, congestion, sore throat and headaches x1 week.  Patient reports waking up today and feeling worse than he had felt.  States sore throat was worse and fever was higher.  Denies ear pain, sinus pain, chest pain, breathing difficulty, abdominal pain, nausea/vomiting or diarrhea.  Patient says a coworker has been ill with similar symptoms but had a negative COVID test.  Has tried OTC meds-Advil Cold and Sinus for symptoms.  OTC meds have helped.  Patient has no other complaints.  HPI  Past Medical History:  Diagnosis Date   Dysplastic nevus 10/13/2018   Moderate atypia, left upper back 7cm lateral to spine medial infra scapular   Gout     There are no problems to display for this patient.   History reviewed. No pertinent surgical history.     Home Medications    Prior to Admission medications   Medication Sig Start Date End Date Taking? Authorizing Provider  indomethacin (INDOCIN) 50 MG capsule Take 1 capsule (50 mg total) by mouth 3 (three) times daily with meals. 03/05/18  Yes Darlyne Russian, MD  promethazine-dextromethorphan (PROMETHAZINE-DM) 6.25-15 MG/5ML syrup Take 5 mLs by mouth 4 (four) times daily as needed. 10/16/21  Yes Danton Clap, PA-C    Family History No family history on file.  Social History Social History   Tobacco Use   Smoking status: Never   Smokeless tobacco: Never  Vaping Use   Vaping Use: Never used  Substance Use Topics   Alcohol use: Yes   Drug use: No     Allergies   Patient has no known allergies.   Review of Systems Review of Systems  Constitutional:  Positive for fatigue and fever.  HENT:  Positive for congestion, rhinorrhea and sore throat. Negative for sinus  pressure and sinus pain.   Respiratory:  Positive for cough. Negative for shortness of breath.   Cardiovascular:  Negative for chest pain.  Gastrointestinal:  Negative for abdominal pain, diarrhea, nausea and vomiting.  Musculoskeletal:  Negative for myalgias.  Neurological:  Positive for headaches. Negative for weakness and light-headedness.  Hematological:  Negative for adenopathy.     Physical Exam Triage Vital Signs ED Triage Vitals  Enc Vitals Group     BP      Pulse      Resp      Temp      Temp src      SpO2      Weight      Height      Head Circumference      Peak Flow      Pain Score      Pain Loc      Pain Edu?      Excl. in [place]?    No data found.  Updated Vital Signs BP (!) 146/104 (BP Location: Left Arm)   Pulse (!) 104   Temp 98.9 F (37.2 C) (Oral)   Resp 18   Ht '5\' 7"'$  (1.702 m)   Wt 227 lb 1.2 oz (103 kg)   SpO2 98%   BMI 35.56 kg/m      Physical Exam Vitals and nursing note reviewed.  Constitutional:  General: He is not in acute distress.    Appearance: Normal appearance. He is well-developed. He is not ill-appearing.  HENT:     Head: Normocephalic and atraumatic.     Nose: Congestion present.     Mouth/Throat:     Mouth: Mucous membranes are moist.     Pharynx: Oropharynx is clear. Posterior oropharyngeal erythema present.  Eyes:     General: No scleral icterus.    Conjunctiva/sclera: Conjunctivae normal.  Cardiovascular:     Rate and Rhythm: Regular rhythm. Tachycardia present.     Heart sounds: Normal heart sounds.  Pulmonary:     Effort: Pulmonary effort is normal. No respiratory distress.     Breath sounds: Normal breath sounds.  Musculoskeletal:     Cervical back: Neck supple.  Skin:    General: Skin is warm and dry.     Capillary Refill: Capillary refill takes less than 2 seconds.  Neurological:     General: No focal deficit present.     Mental Status: He is alert. Mental status is at baseline.     Motor: No weakness.      Gait: Gait normal.  Psychiatric:        Mood and Affect: Mood normal.        Behavior: Behavior normal.      UC Treatments / Results  Labs (all labs ordered are listed, but only abnormal results are displayed) Labs Reviewed  GROUP A STREP BY PCR  SARS CORONAVIRUS 2 BY RT PCR    EKG   Radiology No results found.  Procedures Procedures (including critical care time)  Medications Ordered in UC Medications - No data to display  Initial Impression / Assessment and Plan / UC Course  I have reviewed the triage vital signs and the nursing notes.  Pertinent labs & imaging results that were available during my care of the patient were reviewed by me and considered in my medical decision making (see chart for details).   46 year old male presenting for 1 week history of low-grade fever, fatigue, cough, congestion and sore throat.  Reports symptoms worsened today.  Patient is afebrile.  BP elevated at 146/104.  Pulse elevated at 104 bpm.  On exam he has nasal congestion and erythema posterior pharynx.  Chest clear to auscultation and heart regular rhythm.  PCR COVID test and PCR strep test obtained.  Patient would like to be called with results.  Advised him that we will call and treat accordingly if any positives.  Advised him if he does not hear from [place], those tests are negative and he has another viral illness.  Reviewed current CDC guidelines, isolation protocol and ED precautions if COVID-positive.  Supportive care advised.  I did send Promethazine DM to pharmacy.  He declines a work note.  I advised him to follow-up here as needed if symptoms are worsening or if he is not improving over the next week.  Negative strep and COVID.  Final Clinical Impressions(s) / UC Diagnoses   Final diagnoses:  Upper respiratory tract infection, unspecified type  Sore throat  Acute cough     Discharge Instructions      -We will call with the strep is positive and send antibiotics for  you. - If the COVID is positive we will also call.  You need to isolate 5 days from symptom onset and then wear a mask for 5 days if it is positive.  I would go from when the cough started. -If those tests are  negative, you likely have another viral illness. - I have sent cough medicine to the pharmacy for you.  You can also take ibuprofen or Tylenol and use throat lozenges, Chloraseptic spray.  You should feel better in the next week.     ED Prescriptions     Medication Sig Dispense Auth. Provider   promethazine-dextromethorphan (PROMETHAZINE-DM) 6.25-15 MG/5ML syrup Take 5 mLs by mouth 4 (four) times daily as needed. 118 mL Danton Clap, PA-C      PDMP not reviewed this encounter.   Danton Clap, PA-C 10/16/21 1159

## 2022-09-22 ENCOUNTER — Ambulatory Visit: Payer: Managed Care, Other (non HMO)

## 2022-09-22 DIAGNOSIS — Z09 Encounter for follow-up examination after completed treatment for conditions other than malignant neoplasm: Secondary | ICD-10-CM | POA: Diagnosis present

## 2022-09-22 DIAGNOSIS — Z86010 Personal history of colonic polyps: Secondary | ICD-10-CM | POA: Diagnosis not present

## 2022-09-22 DIAGNOSIS — K5730 Diverticulosis of large intestine without perforation or abscess without bleeding: Secondary | ICD-10-CM | POA: Diagnosis not present

## 2022-09-22 DIAGNOSIS — K640 First degree hemorrhoids: Secondary | ICD-10-CM | POA: Diagnosis not present
